# Patient Record
Sex: Male | Born: 1990 | Race: White | Hispanic: No | Marital: Single | Smoking: Never smoker
Health system: Southern US, Community
[De-identification: ages and names within clinical notes are randomized; demographics above are authoritative.]

## PROBLEM LIST (undated history)

## (undated) HISTORY — PX: NO PAST SURGERIES: SHX2092

---
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
## 2012-04-02 ENCOUNTER — Emergency Department: Payer: Self-pay | Admitting: Emergency Medicine

## 2012-04-02 LAB — TROPONIN I: Troponin-I: <0.02 ng/mL

## 2012-04-02 LAB — COMPREHENSIVE METABOLIC PANEL WITH GFR
Albumin: 4.3 g/dL (ref 3.8–5.6)
Alkaline Phosphatase: 72 U/L — ABNORMAL LOW (ref 98–317)
Anion Gap: 4 — ABNORMAL LOW (ref 7–16)
BUN: 16 mg/dL (ref 7–18)
Bilirubin,Total: 0.3 mg/dL (ref 0.2–1.0)
Calcium, Total: 9.3 mg/dL (ref 9.0–10.7)
Chloride: 106 mmol/L (ref 98–107)
Co2: 29 mmol/L (ref 21–32)
Creatinine: 0.79 mg/dL (ref 0.60–1.30)
EGFR (African American): >60
EGFR (Non-African Amer.): >60
Glucose: 108 mg/dL — ABNORMAL HIGH (ref 65–99)
Osmolality: 279 (ref 275–301)
Potassium: 3.9 mmol/L (ref 3.5–5.1)
SGOT(AST): 30 U/L (ref 10–41)
SGPT (ALT): 68 U/L (ref 12–78)
Sodium: 139 mmol/L (ref 136–145)
Total Protein: 7.9 g/dL (ref 6.4–8.6)

## 2012-04-02 LAB — CBC
HCT: 43 % (ref 40.0–52.0)
HGB: 14.9 g/dL (ref 13.0–18.0)
MCH: 31.4 pg (ref 26.0–34.0)
MCHC: 34.5 g/dL (ref 32.0–36.0)
MCV: 91 fL (ref 80–100)
Platelet: 398 x10 3/mm 3 (ref 150–440)
RBC: 4.74 x10 6/mm 3 (ref 4.40–5.90)
RDW: 12.1 % (ref 11.5–14.5)
WBC: 16.9 x10 3/mm 3 — ABNORMAL HIGH (ref 3.8–10.6)

## 2012-07-04 ENCOUNTER — Emergency Department: Payer: Self-pay | Admitting: Emergency Medicine

## 2012-07-04 LAB — COMPREHENSIVE METABOLIC PANEL
Albumin: 4 g/dL (ref 3.8–5.6)
Alkaline Phosphatase: 66 U/L — ABNORMAL LOW (ref 98–317)
Anion Gap: 7 (ref 7–16)
BUN: 15 mg/dL (ref 7–18)
Calcium, Total: 8.9 mg/dL — ABNORMAL LOW (ref 9.0–10.7)
Co2: 28 mmol/L (ref 21–32)
Glucose: 93 mg/dL (ref 65–99)
Osmolality: 282 (ref 275–301)
SGPT (ALT): 70 U/L (ref 12–78)
Sodium: 141 mmol/L (ref 136–145)

## 2012-07-04 LAB — CBC
HCT: 44.8 % (ref 40.0–52.0)
HGB: 15.2 g/dL (ref 13.0–18.0)
MCH: 30.9 pg (ref 26.0–34.0)
MCHC: 33.9 g/dL (ref 32.0–36.0)
Platelet: 357 10*3/uL (ref 150–440)
RBC: 4.91 10*6/uL (ref 4.40–5.90)
RDW: 12 % (ref 11.5–14.5)
WBC: 16.5 10*3/uL — ABNORMAL HIGH (ref 3.8–10.6)

## 2013-09-18 ENCOUNTER — Emergency Department: Payer: Self-pay

## 2020-04-13 ENCOUNTER — Ambulatory Visit
Admission: EM | Admit: 2020-04-13 | Discharge: 2020-04-13 | Disposition: A | Discharge status: Home / Self Care | Payer: Managed Care, Other (non HMO) | Attending: Emergency Medicine | Admitting: Emergency Medicine

## 2020-04-13 ENCOUNTER — Other Ambulatory Visit: Payer: Self-pay

## 2020-04-13 DIAGNOSIS — S93492A Sprain of other ligament of left ankle, initial encounter: Secondary | ICD-10-CM | POA: Diagnosis not present

## 2020-04-13 DIAGNOSIS — S93422A Sprain of deltoid ligament of left ankle, initial encounter: Secondary | ICD-10-CM

## 2020-04-13 MED ORDER — IBUPROFEN 600 MG PO TABS: 600.0000 mg | ORAL_TABLET | Freq: Four times a day (QID) | ORAL | 0 refills | Status: AC | PRN

## 2020-04-13 NOTE — Discharge Instructions (Addendum)
10 mg of ibuprofen combined with 1000 mg of Tylenol together 3-4 times a day as needed.  Ice your ankle when it is inflamed.  Wear the ASO as needed for comfort.  You IcyHot if it helps.  Follow-up with one of the orthopedics/sports medicine centers.  I think you would benefit from physical therapy.

## 2020-04-13 NOTE — ED Triage Notes (Signed)
Patient complains of left ankle pain that has been constant. Reports that area seems to flare up at times with certain movement. Patient states that he rolled his ankle around 2-3 months ago.

## 2020-04-13 NOTE — ED Provider Notes (Signed)
HPI  SUBJECTIVE:  Frederick Farrell is a 29 y.o. male who presents with constant left sharp, throbbing lateral/dorsal ankle pain, swelling returning 3 weeks ago.  States that his ankle feels "tight".  Does not recall any particular trauma to the ankle, but states that it flares up from time to time. After inverting his ankle 3 months ago.  He reports limitation of motion secondary to pain.  No bruising, numbness or tingling.  He has tried icy hot with improvement in swelling and 400 mg of ibuprofen occasionally.  Symptoms are worse with inversion/eversion, dorsiflexion.  He has a past medical history of recurrent left ankle sprains since childhood.  No history of diabetes, smoking.  PMD: None.  Orthopedics: None.   History reviewed. No pertinent past medical history.  Past Surgical History:  Procedure Laterality Date  . NO PAST SURGERIES      Family History  Problem Relation Age of Onset  . Cancer Mother   . Healthy Father     Social History   Tobacco Use  . Smoking status: Never Smoker  . Smokeless tobacco: Current User    Types: Snuff  Vaping Use  . Vaping Use: Never used  Substance Use Topics  . Alcohol use: Yes    Comment: occasionally  . Drug use: Not Currently    No current facility-administered medications for this encounter.  Current Outpatient Medications:  .  ibuprofen (ADVIL) 600 MG tablet, Take 1 tablet (600 mg total) by mouth every 6 (six) hours as needed., Disp: 30 tablet, Rfl: 0  Allergies  Allergen Reactions  . Penicillins Hives     ROS  As noted in HPI.   Physical Exam  BP (!) 151/96 (BP Location: Right Arm)   Pulse 94   Temp 97.9 F (36.6 C) (Oral)   Resp 17   Ht 5\' 11"  (1.803 m)   Wt 117.9 kg   SpO2 100%   BMI 36.26 kg/m   Constitutional: Well developed, well nourished, no acute distress Eyes:  EOMI, conjunctiva normal bilaterally HENT: Normocephalic, atraumatic,mucus membranes moist Respiratory: Normal inspiratory  effort Cardiovascular: Normal rate GI: nondistended skin: No rash, skin intact Musculoskeletal:  L Ankle Tenderness, Proximal fibula NT, Distal fibula NT, Medial malleolus NT ,  Deltoid ligaments medially tender,  ATFL tender, calcaneofibular ligament NT , posterior tablofibular ligament NT ,  Achilles NT, calcaneus  NT,  Proximal 5th metatarsal NT, Midfoot NT, distal NVI with baseline sensation / motor to foot with DP 2+. Pain with dorsiflexion. no pain with plantarflexion. Pain with inversion/eversion. - bruising. - squeeze test.  Ant drawer test stable. Pt able to bear weight in dept. Mild swelling L ankle. No erythema, increased temperature.  Neurologic: Alert & oriented x 3, no focal neuro deficits Psychiatric: Speech and behavior appropriate   ED Course   Medications - No data to display  Orders Placed This Encounter  Procedures  . Apply ASO ankle    Standing Status:   Standing    Number of Occurrences:   1    Order Specific Question:   Laterality    Answer:   Left    No results found for this or any previous visit (from the past 24 hour(s)). No results found.  ED Clinical Impression  1. Sprain of anterior talofibular ligament of left ankle, initial encounter   2. Sprain of deltoid ligament of left ankle, initial encounter      ED Assessment/Plan  Pt with chronic L ankle sprain. No XR because  he has been weight bearing on it for 3 months and has no bony tenderness. ASO, tylenol/IBU, f/u with [facility] [place]/ [facility] for PT and further management.   Discussed  MDM, treatment plan, and plan for follow-up with patient. . patient agrees with plan.   Meds ordered this encounter  Medications  . ibuprofen (ADVIL) 600 MG tablet    Sig: Take 1 tablet (600 mg total) by mouth every 6 (six) hours as needed.    Dispense:  30 tablet    Refill:  0    *This clinic note was created using Scientist, clinical (histocompatibility and immunogenetics). Therefore, there may be  occasional mistakes despite careful proofreading.   ?    Domenick Gong, MD 04/14/20 1019

## 2021-09-08 ENCOUNTER — Ambulatory Visit
Admission: RE | Admit: 2021-09-08 | Discharge: 2021-09-08 | Disposition: A | Discharge status: Home / Self Care | Payer: Managed Care, Other (non HMO) | Attending: Physician Assistant | Admitting: Physician Assistant

## 2021-09-08 ENCOUNTER — Ambulatory Visit
Admission: RE | Admit: 2021-09-08 | Discharge: 2021-09-08 | Disposition: A | Discharge status: Home / Self Care | Payer: Managed Care, Other (non HMO) | Source: Ambulatory Visit | Attending: Physician Assistant | Admitting: Physician Assistant

## 2021-09-08 ENCOUNTER — Other Ambulatory Visit: Payer: Self-pay | Admitting: Physician Assistant

## 2021-09-08 DIAGNOSIS — Z0000 Encounter for general adult medical examination without abnormal findings: Secondary | ICD-10-CM | POA: Insufficient documentation

## 2023-08-21 IMAGING — CR DG CHEST 1V
1 series · 2 of 2 positions shown · non-contrast
Comparison: 04/02/2012

CLINICAL DATA: Industrial hygiene.

EXAM:
CHEST  1 VIEW

[Series 1: dg chest 1 view · 0.14mm/px · 2 of 2 slices shown]
[im 1/2]
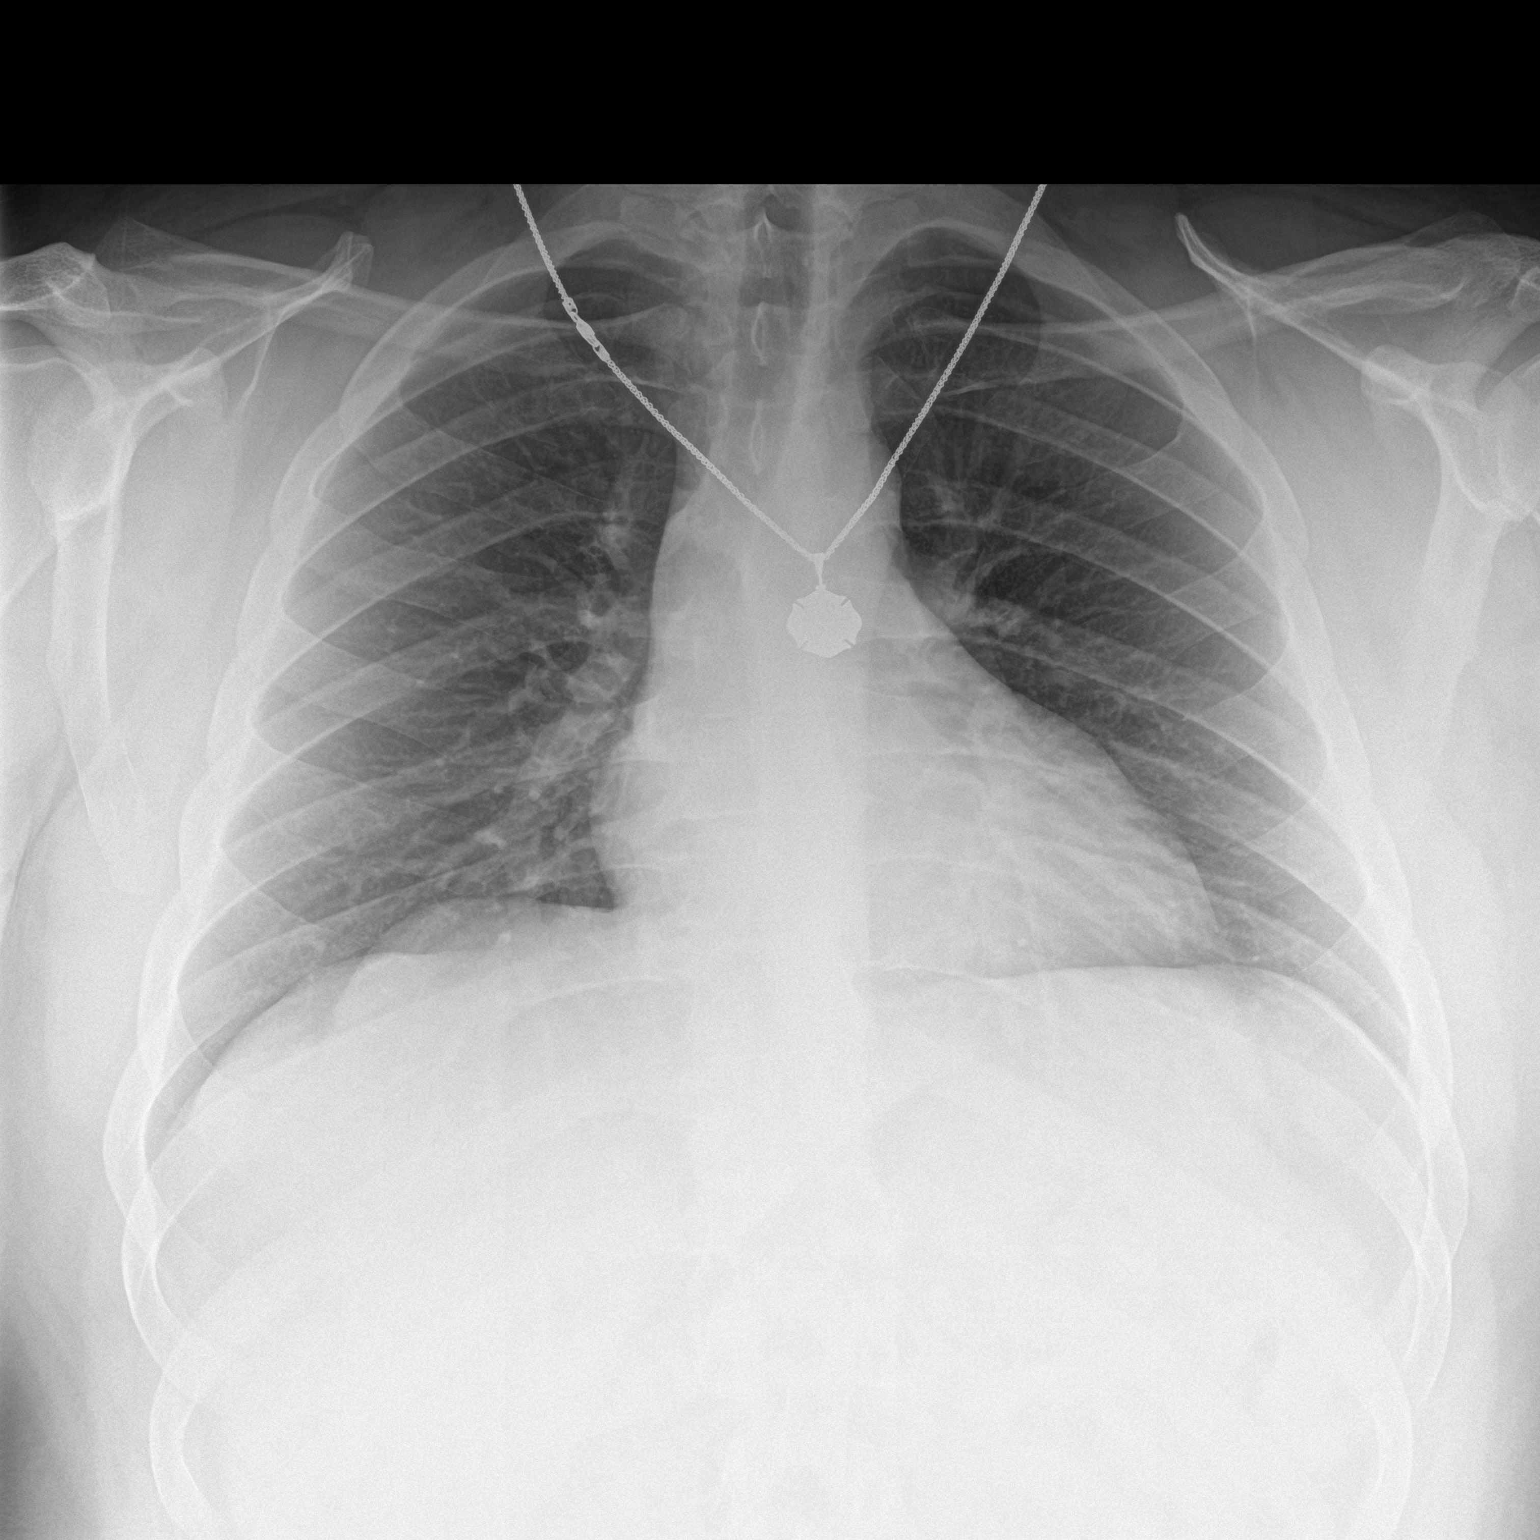
[im 2/2]
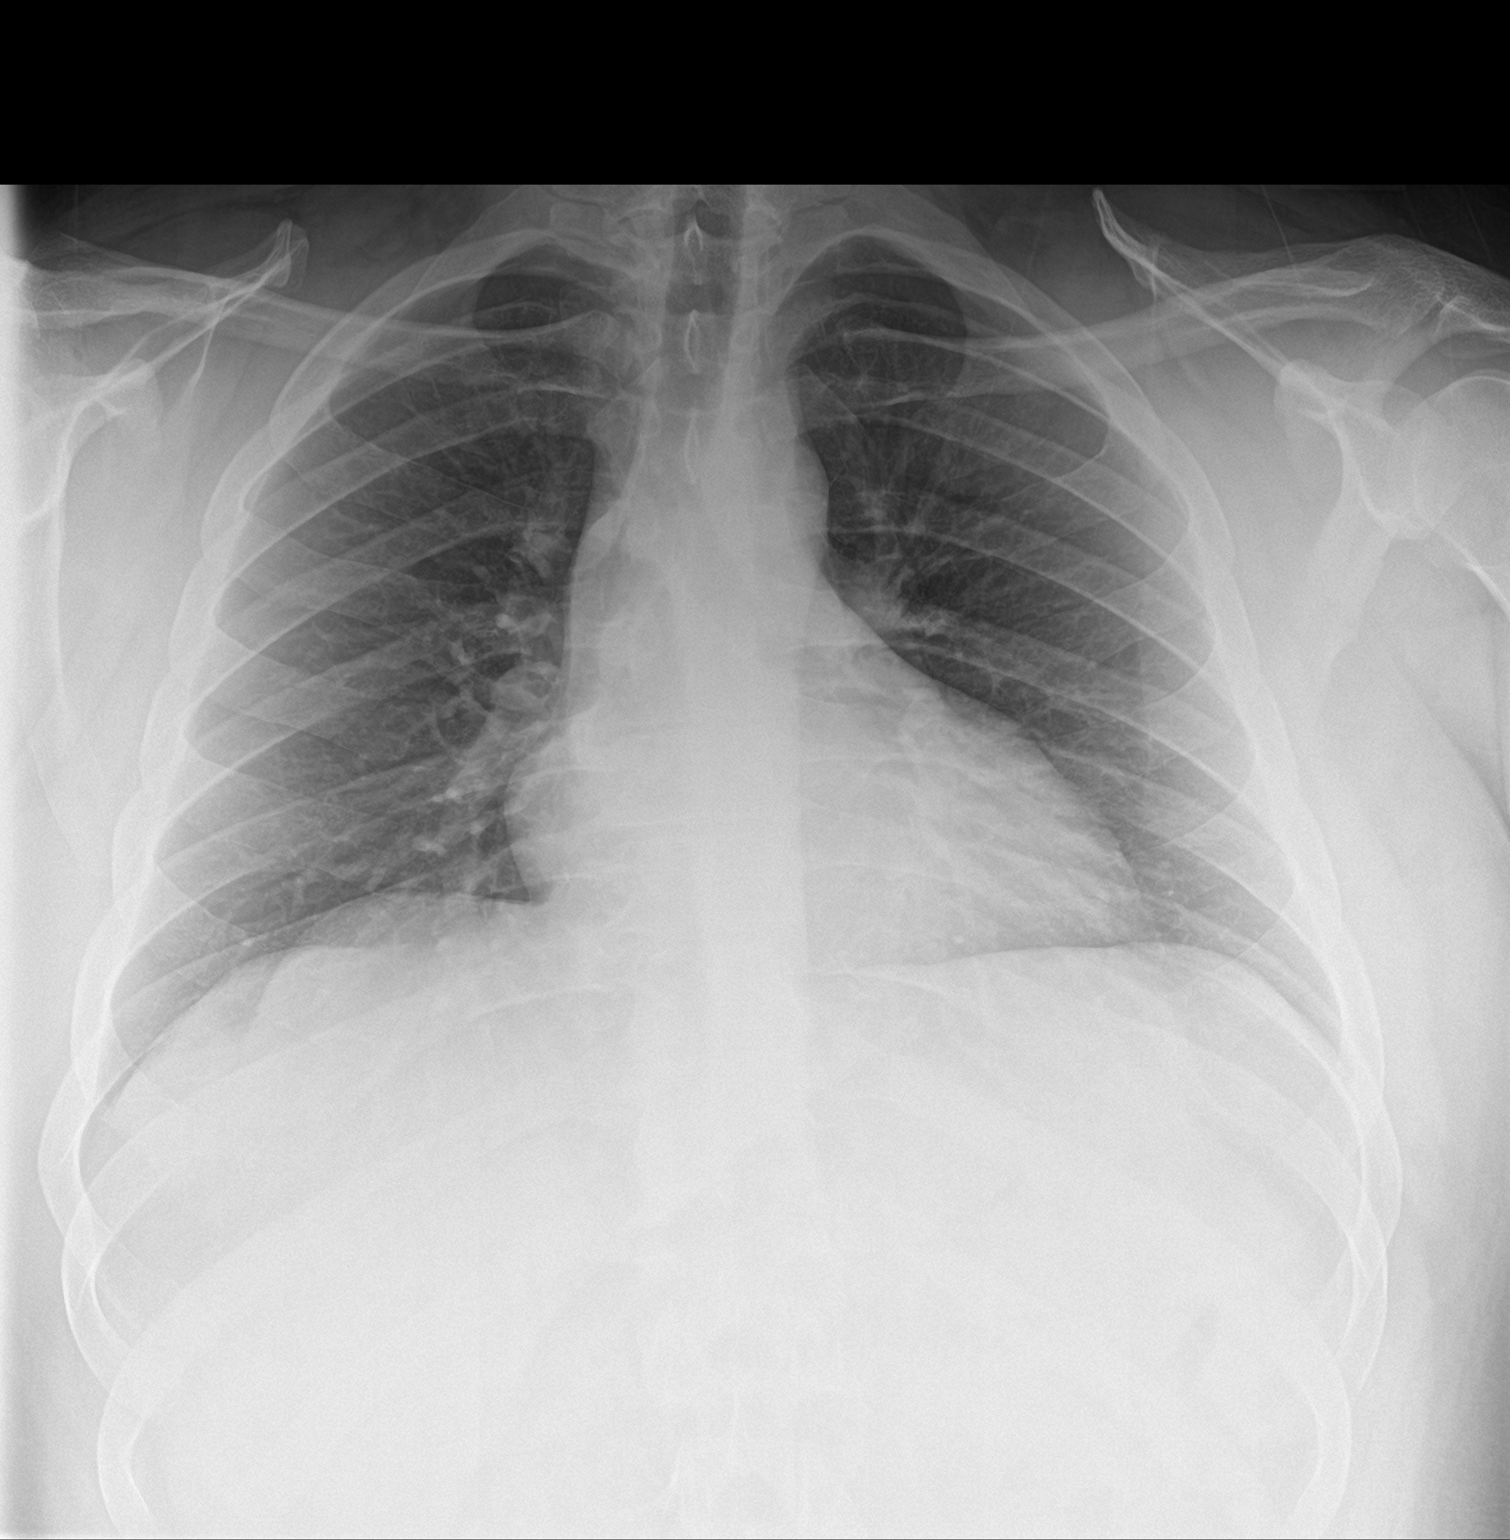

[2 of 2 positions shown; findings below may reference images not displayed]

FINDINGS: The cardiomediastinal contours are normal. The lungs are clear.
Pulmonary vasculature is normal. No consolidation, pleural effusion,
or pneumothorax. No acute osseous abnormalities are seen.
IMPRESSION: Negative frontal view of the chest.
